# Patient Record
Sex: Female | Born: 1965 | Race: Asian | Hispanic: No | Marital: Married | Smoking: Never smoker
Health system: Southern US, Community
[De-identification: ages and names within clinical notes are randomized; demographics above are authoritative.]

## PROBLEM LIST (undated history)

## (undated) DIAGNOSIS — I10 Essential (primary) hypertension: Secondary | ICD-10-CM

## (undated) HISTORY — PX: NO PAST SURGERIES: SHX2092

---

## 2018-12-07 ENCOUNTER — Ambulatory Visit: Payer: Worker's Compensation

## 2018-12-07 ENCOUNTER — Ambulatory Visit
Admission: EM | Admit: 2018-12-07 | Discharge: 2018-12-07 | Disposition: A | Discharge status: Home / Self Care | Payer: Worker's Compensation | Attending: Family Medicine | Admitting: Family Medicine

## 2018-12-07 ENCOUNTER — Ambulatory Visit (INDEPENDENT_AMBULATORY_CARE_PROVIDER_SITE_OTHER): Payer: Worker's Compensation

## 2018-12-07 ENCOUNTER — Other Ambulatory Visit: Payer: Self-pay

## 2018-12-07 DIAGNOSIS — W228XXA Striking against or struck by other objects, initial encounter: Secondary | ICD-10-CM | POA: Diagnosis not present

## 2018-12-07 DIAGNOSIS — S2232XA Fracture of one rib, left side, initial encounter for closed fracture: Secondary | ICD-10-CM | POA: Diagnosis not present

## 2018-12-07 DIAGNOSIS — R0789 Other chest pain: Secondary | ICD-10-CM | POA: Diagnosis not present

## 2018-12-07 HISTORY — DX: Essential (primary) hypertension: I10

## 2018-12-07 NOTE — ED Provider Notes (Signed)
[facility]    CSN: [id number] Arrival date & time: 12/07/18  0850      History   Chief Complaint No chief complaint on file.   HPI Taylor Sloan is a 53 y.o. female.   53 yo female with a c/o left lower rib pain after injuring it 3 days ago (Friday) while at work. States she was leaning against a hard surface to reach in a large tote when she felt sudden pop and pain. Denies any shortness of breath.      Past Medical History:  Diagnosis Date  . Hypertension     There are no active problems to display for this patient.   History reviewed. No pertinent surgical history.  OB History   No obstetric history on file.      Home Medications    Prior to Admission medications   Medication Sig Start Date End Date Taking? Authorizing Provider  amLODipine (NORVASC) 5 MG tablet Take by mouth. 08/19/18 08/14/19 Yes [provider]  hydrochlorothiazide (HYDRODIURIL) 12.5 MG tablet Take by mouth. 07/21/18  Yes [provider]  levonorgestrel (MIRENA) 20 MCG/24HR IUD by Intrauterine route. 06/09/15  Yes [provider]    Family History Family History  Family history unknown: Yes    Social History Social History   Tobacco Use  . Smoking status: Never Smoker  . Smokeless tobacco: Never Used  Substance Use Topics  . Alcohol use: Yes    Comment: daily one glass of wine  . Drug use: Never     Allergies   Pollen extract   Review of Systems Review of Systems   Physical Exam Triage Vital Signs ED Triage Vitals [12/07/18 0904]  Enc Vitals Group     BP (!) 144/91     Pulse Rate 67     Resp 16     Temp 98.3 F (36.8 C)     Temp Source Oral     SpO2 100 %     Weight 130 lb (59 kg)     Height 5\' 3"  (1.6 m)     Head Circumference      Peak Flow      Pain Score 3     Pain Loc      Pain Edu?      Excl. in [place]?    No data found.  Updated Vital Signs BP (!) 144/91 (BP Location: Right Arm)   Pulse 67   Temp 98.3 F (36.8  C) (Oral)   Resp 16   Ht 5\' 3"  (1.6 m)   Wt 59 kg   SpO2 100%   BMI 23.03 kg/m   Visual Acuity Right Eye Distance:   Left Eye Distance:   Bilateral Distance:    Right Eye Near:   Left Eye Near:    Bilateral Near:     Physical Exam Vitals signs and nursing note reviewed.  Constitutional:      General: She is not in acute distress.    Appearance: She is not toxic-appearing or diaphoretic.  Cardiovascular:     Rate and Rhythm: Normal rate.     Heart sounds: Normal heart sounds.  Pulmonary:     Effort: Pulmonary effort is normal. No respiratory distress.     Breath sounds: Normal breath sounds. No stridor. No wheezing, rhonchi or rales.  Chest:     Chest wall: Tenderness (left lower ribs) present.  Neurological:     Mental Status: She is alert.  UC Treatments / Results  Labs (all labs ordered are listed, but only abnormal results are displayed) Labs Reviewed - No data to display  EKG   Radiology Dg Ribs Unilateral W/chest Left  Result Date: 12/07/2018 CLINICAL DATA:  53 year old female with pain over the left chest. EXAM: LEFT RIBS AND CHEST - 3+ VIEW COMPARISON:  None. FINDINGS: There is a minimally displaced fracture of the anterolateral left ninth rib. The lungs are clear. There is no pleural effusion or pneumothorax. The cardiac silhouette is within normal limits. IMPRESSION: Minimally displaced left ninth rib fracture.  No pneumothorax. Electronically Signed   By: Anner Crete M.D.   On: 12/07/2018 09:44    Procedures Procedures (including critical care time)  Medications Ordered in UC Medications - No data to display  Initial Impression / Assessment and Plan / UC Course  I have reviewed the triage vital signs and the nursing notes.  Pertinent labs & imaging results that were available during my care of the patient were reviewed by me and considered in my medical decision making (see chart for details).      Final Clinical Impressions(s) / UC  Diagnoses   Final diagnoses:  Closed fracture of one rib of left side, initial encounter     Discharge Instructions     Rest, ice/heat, tylenol/advil    ED Prescriptions    None     1. x-ray results and diagnosis reviewed with patient 2. Offered rx for pain medication however patient declined 3. Recommend supportive treatment as above 4. 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 Follow-up prn  PDMP not reviewed this encounter.   Norval Gable, MD 12/07/18 2104

## 2018-12-07 NOTE — ED Triage Notes (Signed)
Pt reports at work on Friday she reached over a large tote and injured her left ribs.  Pain to left side 3/10

## 2018-12-07 NOTE — Discharge Instructions (Signed)
Rest, ice/heat, tylenol/advil °

## 2019-03-15 ENCOUNTER — Ambulatory Visit (INDEPENDENT_AMBULATORY_CARE_PROVIDER_SITE_OTHER): Payer: Worker's Compensation

## 2019-03-15 ENCOUNTER — Other Ambulatory Visit: Payer: Self-pay

## 2019-03-15 ENCOUNTER — Ambulatory Visit
Admission: EM | Admit: 2019-03-15 | Discharge: 2019-03-15 | Disposition: A | Discharge status: Home / Self Care | Payer: Worker's Compensation | Attending: Family Medicine | Admitting: Family Medicine

## 2019-03-15 DIAGNOSIS — M1812 Unilateral primary osteoarthritis of first carpometacarpal joint, left hand: Secondary | ICD-10-CM

## 2019-03-15 DIAGNOSIS — M79642 Pain in left hand: Secondary | ICD-10-CM | POA: Diagnosis not present

## 2019-03-15 MED ORDER — MELOXICAM 15 MG PO TABS: 15.0000 mg | ORAL_TABLET | Freq: Every day | ORAL | 0 refills | Status: AC

## 2019-03-15 NOTE — Discharge Instructions (Addendum)
Use splint full-time for the first week including sleeping with the splint.  May remove the splint for personal care.  Follow-up with orthopedic surgery or hand surgery if you continue to have discomfort in the future.

## 2019-03-15 NOTE — ED Triage Notes (Signed)
Patient complains of bilateral wrist pain that started 6 months ago. States that she has been trying to treat herself. States that at work she is on the keyboard a lot and is concerned for Carpal Tunnel. Patient states that the pain has worsened over the last week. States that pain is mainly at her thumbs.

## 2019-03-15 NOTE — ED Provider Notes (Signed)
[facility]    CSN: [id number] Arrival date & time: 03/15/19  1221      History   Chief Complaint Chief Complaint  Patient presents with   Work Related Injury   Wrist Pain    bilateral    HPI Taylor Sloan is a 54 y.o. female.   HPI  54 year old female presents with bilateral wrist pain that is more prominent on the left that started about 6 months ago.  She been trying to treat herself with the heat and ice.  For the last couple weeks the pain has intensified particularly on the left.  She is right-hand dominant.  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  Indicates her thumbs is the main area of pain particularly at the base of the thumb.  Not experience carpal tunnel symptoms.  States grasp and lifting with a heavy object in particular causes her to have an increase in her pain.  Is not changed any of her usual activities with increases in grasping pinching or pulling.  Has tried supporting her wrist when typing but this does seem to help either.      Past Medical History:  Diagnosis Date   Hypertension     There are no problems to display for this patient.   Past Surgical History:  Procedure Laterality Date   NO PAST SURGERIES      OB History   No obstetric history on file.      Home Medications    Prior to Admission medications   Medication Sig Start Date End Date Taking? Authorizing Provider  amLODipine (NORVASC) 5 MG tablet Take by mouth. 08/19/18 08/14/19 Yes [provider]  hydrochlorothiazide (HYDRODIURIL) 12.5 MG tablet Take by mouth. 07/21/18  Yes [provider]  levonorgestrel (MIRENA) 20 MCG/24HR IUD by Intrauterine route. 06/09/15  Yes [provider]  meloxicam (MOBIC) 15 MG tablet Take 1 tablet (15 mg total) by mouth daily. Take with food 03/15/19   Lorin Picket, PA-C    Family History Family History  Family history unknown: Yes    Social History Social History   Tobacco Use   Smoking status:  Never Smoker   Smokeless tobacco: Never Used  Substance Use Topics   Alcohol use: Yes    Comment: daily one glass of wine   Drug use: Never     Allergies   Pollen extract   Review of Systems Review of Systems  Constitutional: Positive for activity change. Negative for appetite change, chills, diaphoresis, fatigue and fever.  Musculoskeletal: Positive for arthralgias.  All other systems reviewed and are negative.    Physical Exam Triage Vital Signs ED Triage Vitals  Enc Vitals Group     BP 03/15/19 1323 (!) 137/99     Pulse Rate 03/15/19 1323 71     Resp 03/15/19 1323 16     Temp 03/15/19 1323 98.5 F (36.9 C)     Temp Source 03/15/19 1323 Oral     SpO2 03/15/19 1323 99 %     Weight 03/15/19 1319 130 lb (59 kg)     Height 03/15/19 1319 5\' 3"  (1.6 m)     Head Circumference --      Peak Flow --      Pain Score 03/15/19 1319 3     Pain Loc --      Pain Edu? --      Excl. in [place]? --    No data found.  Updated Vital Signs BP Marland Kitchen)  137/99 (BP Location: Right Arm)    Pulse 71    Temp 98.5 F (36.9 C) (Oral)    Resp 16    Ht 5\' 3"  (1.6 m)    Wt 130 lb (59 kg)    SpO2 99%    BMI 23.03 kg/m   Visual Acuity Right Eye Distance:   Left Eye Distance:   Bilateral Distance:    Right Eye Near:   Left Eye Near:    Bilateral Near:     Physical Exam Vitals and nursing note reviewed.  Constitutional:      General: She is not in acute distress.    Appearance: Normal appearance. She is normal weight. She is not ill-appearing or toxic-appearing.  HENT:     Head: Normocephalic and atraumatic.  Eyes:     Conjunctiva/sclera: Conjunctivae normal.  Musculoskeletal:        General: Tenderness present. No swelling, deformity or signs of injury. Normal range of motion.     Cervical back: Normal range of motion and neck supple.     Comments: Examination of the left hand particularly of the thumb shows no swelling ecchymosis warmth or edema present.  She has no tenderness of the  distal ulna or radius.  Good range of motion pronation supination flexion and extension.  Tenderness is mild but seems to be maximum at the first the MP joint on the left.  There is no crepitus present.  No deformity is seen.  Is a negative Tinel's at the wrist and negative Phalen's.  Is mildly positive grind sign.  She has a negative Finkelstein's test.  Lifting and hand in pronation does not exacerbate her symptoms.  Skin:    General: Skin is warm and dry.  Neurological:     General: No focal deficit present.     Mental Status: She is alert and oriented to person, place, and time.  Psychiatric:        Mood and Affect: Mood normal.        Behavior: Behavior normal.        Thought Content: Thought content normal.        Judgment: Judgment normal.      UC Treatments / Results  Labs (all labs ordered are listed, but only abnormal results are displayed) Labs Reviewed - No data to display  EKG   Radiology DG Hand Complete Left  Result Date: 03/15/2019 CLINICAL DATA:  Intermittent left hand pain for 6 months. No known injury. EXAM: LEFT HAND - COMPLETE 3+ VIEW COMPARISON:  None. FINDINGS: There is no evidence of fracture or dislocation. There is joint space narrowing and osteophytosis are seen at the IP joint of the thumb and DIP joints of the index and long fingers. Soft tissues are unremarkable. IMPRESSION: No acute abnormality. 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 Electronically Signed   By: 03/17/2019 M.D.   On: 03/15/2019 15:12    Procedures Procedures (including critical care time)  Medications Ordered in UC Medications - No data to display  Initial Impression / Assessment and Plan / UC Course  I have reviewed the triage vital signs and the nursing notes.  Pertinent labs & imaging results that were available during my care of the patient were reviewed by me and considered in my medical decision making (see  chart for details).   54 year old female presents with bilateral wrist pain indicating the base of her thumbs that she has had  for 6 months.  She states that the left is far worse than the right.  She is right-hand dominant.  She been trying to treat herself but without much success.  She states that the pain is worsened over the last week.  She has noticed that grasping while lifting is painful also with more use the keyboard which is her job.  I have reviewed the x-rays with the patient.  It shows mild to moderate osteoarthritis of the IP joint of the thumb and DIP joints of the index and long fingers.  Pain is mostly centered over the first [facility] of the thumb on the left.  I have told her we will treat this conservatively at this time.  I have placed her into a radial gutter splint that she will use continuously for 1 week removing only for personal care.  She will then use it for activities that require the use of her thumb if she suffers any exacerbations.  We will also place her on Mobic 15 mg daily with food.  Take this for approximately 2 weeks.  If she is not improving I recommend that she follow-up with orthopedic or hand surgery.  Usually goes to [facility] and will arrange this through her primary care provider as necessary.   Final Clinical Impressions(s) / UC Diagnoses   Final diagnoses:  Arthritis of carpometacarpal (CMC) joint of left thumb     Discharge Instructions     Use splint full-time for the first week including sleeping with the splint.  May remove the splint for personal care.  Follow-up with orthopedic surgery or hand surgery if you continue to have discomfort in the future.    ED Prescriptions    Medication Sig Dispense Auth. Provider   meloxicam (MOBIC) 15 MG tablet Take 1 tablet (15 mg total) by mouth daily. Take with food 30 tablet Lutricia Feil, PA-C     PDMP not reviewed this encounter.   Lutricia Feil, PA-C 03/15/19 1535

## 2021-04-24 IMAGING — CR DG RIBS W/ CHEST 3+V*L*
5 series · 5 of 5 positions shown · non-contrast
Comparison: None.

CLINICAL DATA: 53-year-old female with pain over the left chest.

EXAM:
LEFT RIBS AND CHEST - 3+ VIEW

[chest pa]
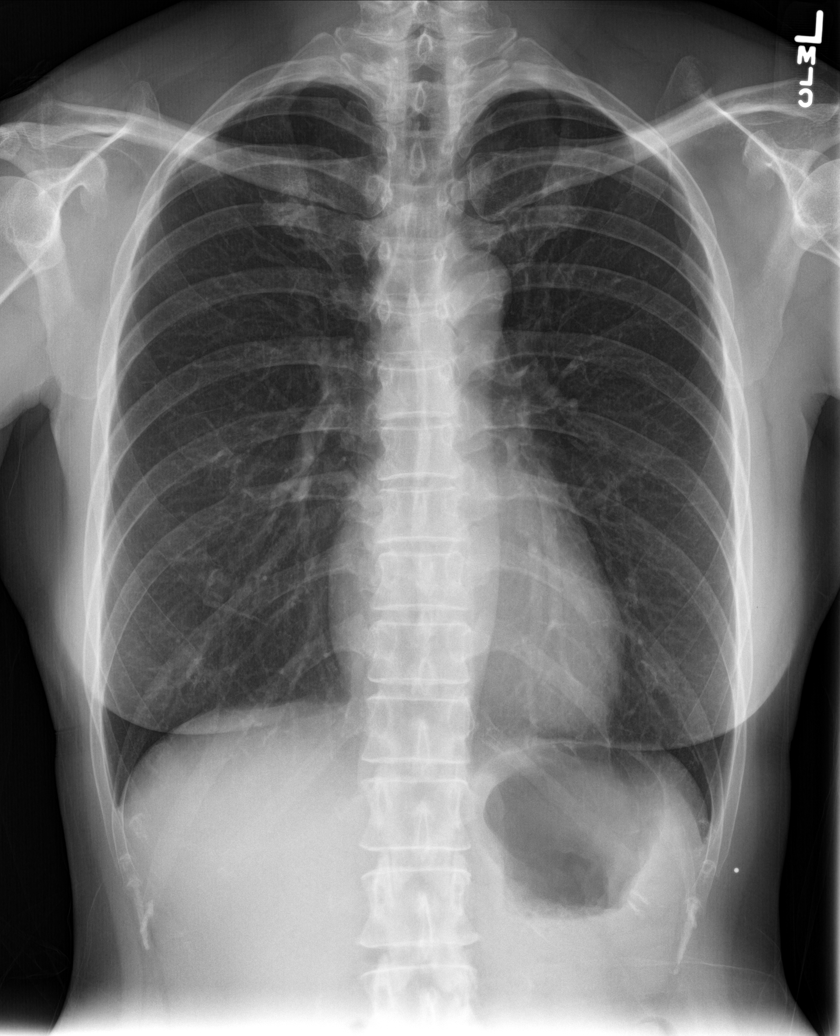

[rib pa (1 of 2)]
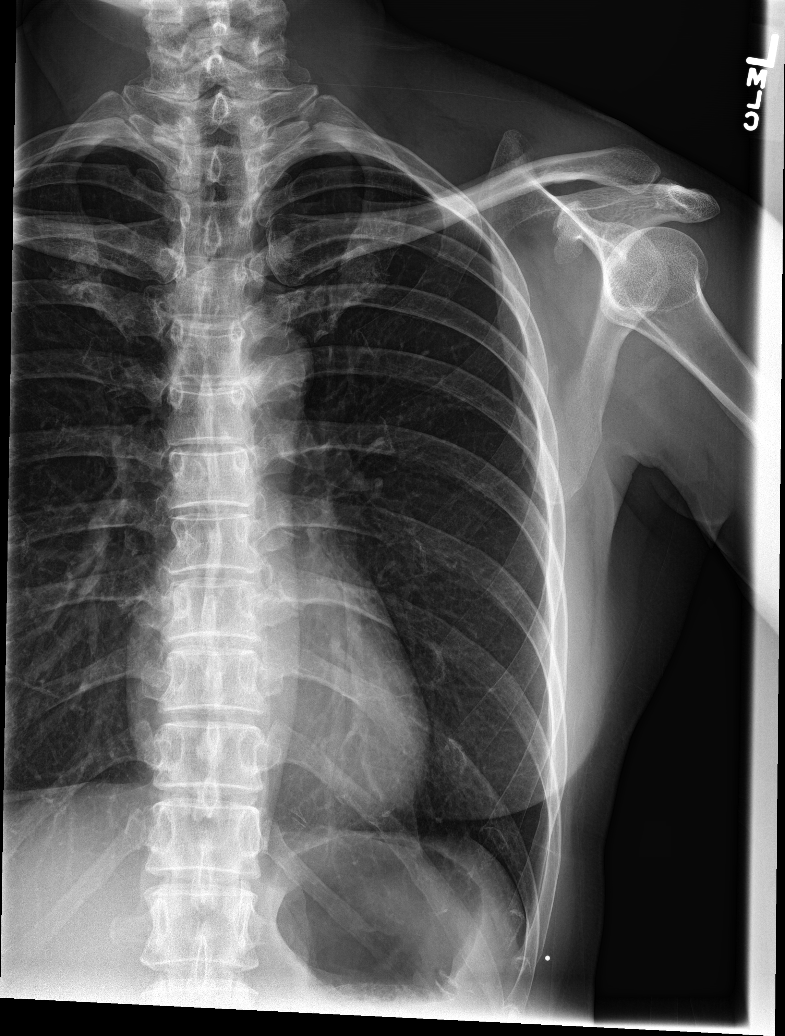

[rib pa (2 of 2)]
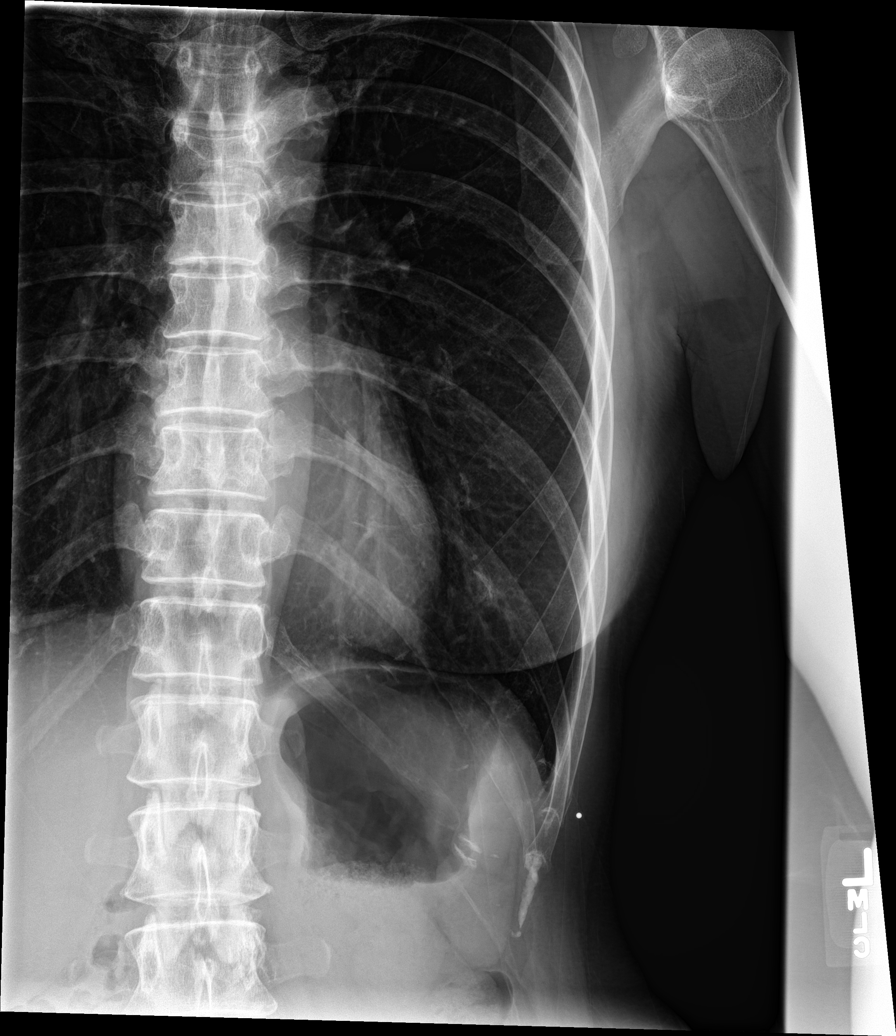

[rib obl (1 of 2)]
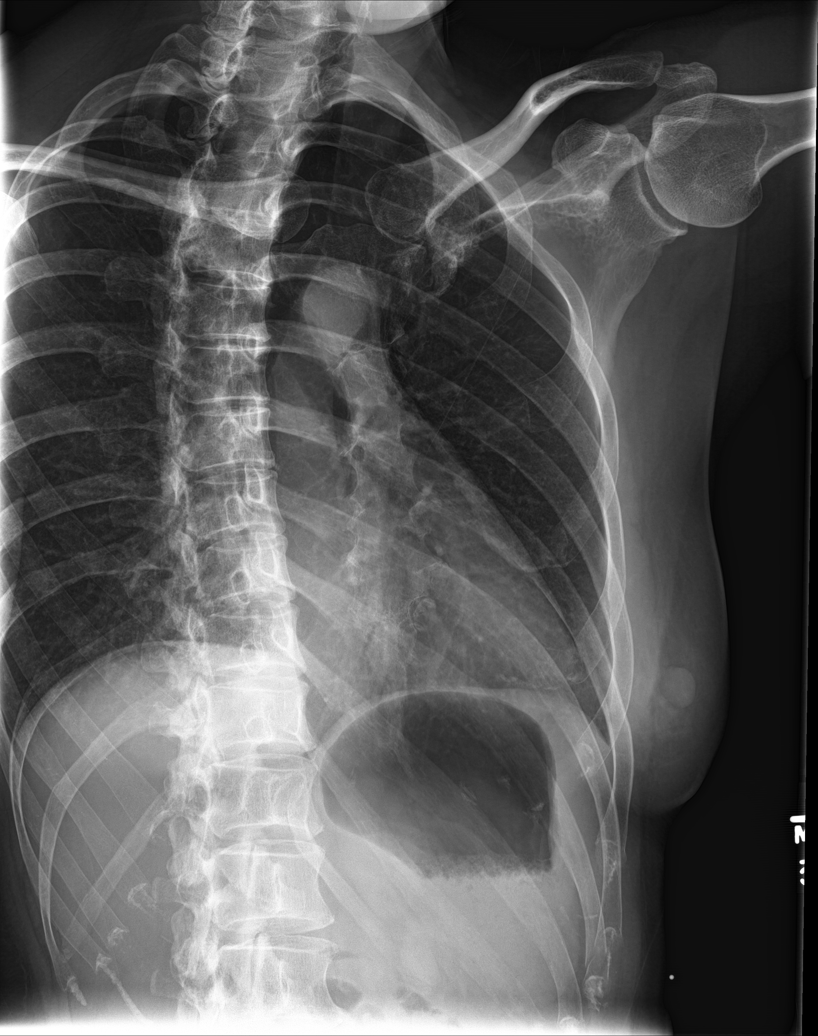

[rib obl (2 of 2)]
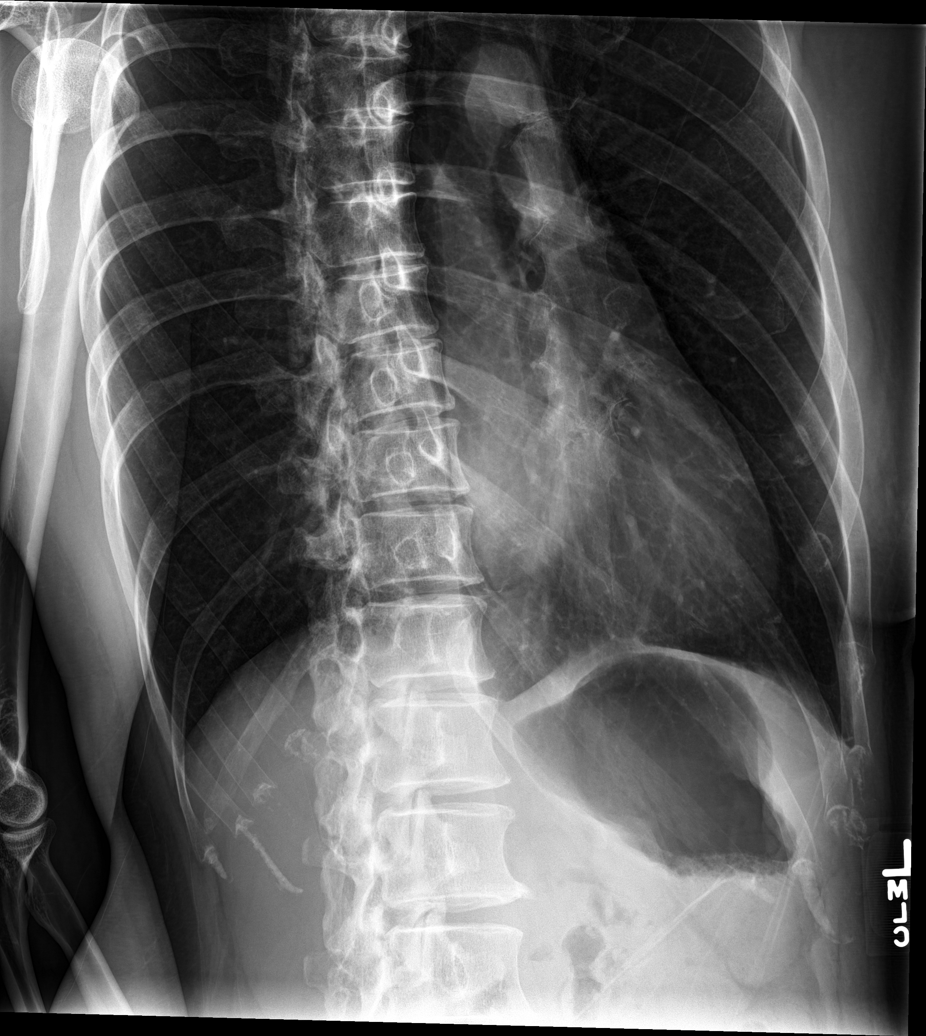

[5 of 5 positions shown; findings below may reference images not displayed]

FINDINGS: There is a minimally displaced fracture of the anterolateral left
ninth rib. The lungs are clear. There is no pleural effusion or
pneumothorax. The cardiac silhouette is within normal limits.
IMPRESSION: Minimally displaced left ninth rib fracture.  No pneumothorax.
